# Patient Record
Sex: Female | Born: 2000 | Race: Black or African American | Hispanic: No | Marital: Single | Smoking: Never smoker
Health system: Southern US, Community
[De-identification: ages and names within clinical notes are randomized; demographics above are authoritative.]

---
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
## 2018-02-25 DIAGNOSIS — H60392 Other infective otitis externa, left ear: Secondary | ICD-10-CM | POA: Diagnosis not present

## 2019-04-04 DIAGNOSIS — Z0000 Encounter for general adult medical examination without abnormal findings: Secondary | ICD-10-CM | POA: Diagnosis not present

## 2019-04-04 DIAGNOSIS — Z68.41 Body mass index (BMI) pediatric, 5th percentile to less than 85th percentile for age: Secondary | ICD-10-CM | POA: Diagnosis not present

## 2019-04-04 DIAGNOSIS — Z713 Dietary counseling and surveillance: Secondary | ICD-10-CM | POA: Diagnosis not present
